# Patient Record
Sex: Male | Born: 2000 | Race: White | Hispanic: No | Marital: Single | Smoking: Never smoker
Health system: Southern US, Community
[De-identification: ages and names within clinical notes are randomized; demographics above are authoritative.]

---
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
## 2019-08-27 ENCOUNTER — Emergency Department (HOSPITAL_COMMUNITY)
Admission: EM | Admit: 2019-08-27 | Discharge: 2019-08-28 | Disposition: A | Discharge status: Home / Self Care | Payer: 59 | Attending: Emergency Medicine | Admitting: Emergency Medicine

## 2019-08-27 ENCOUNTER — Other Ambulatory Visit: Payer: Self-pay

## 2019-08-27 ENCOUNTER — Encounter (HOSPITAL_COMMUNITY): Payer: Self-pay | Admitting: Emergency Medicine

## 2019-08-27 DIAGNOSIS — Z5321 Procedure and treatment not carried out due to patient leaving prior to being seen by health care provider: Secondary | ICD-10-CM | POA: Insufficient documentation

## 2019-08-27 DIAGNOSIS — H5712 Ocular pain, left eye: Secondary | ICD-10-CM | POA: Insufficient documentation

## 2019-08-27 NOTE — ED Triage Notes (Signed)
Pt sent from [facility] to the ED to have a piece of rust removed for his left eye.

## 2020-02-20 IMAGING — CR DG SHOULDER 2+V*L*
3 series · 3 of 3 positions shown · non-contrast
Comparison: None.

CLINICAL DATA: 17-year-old male with chronic left shoulder pain. No
known injury. Initial encounter.

EXAM:
LEFT SHOULDER - 2+ VIEW

[w shoulder ap internal left]
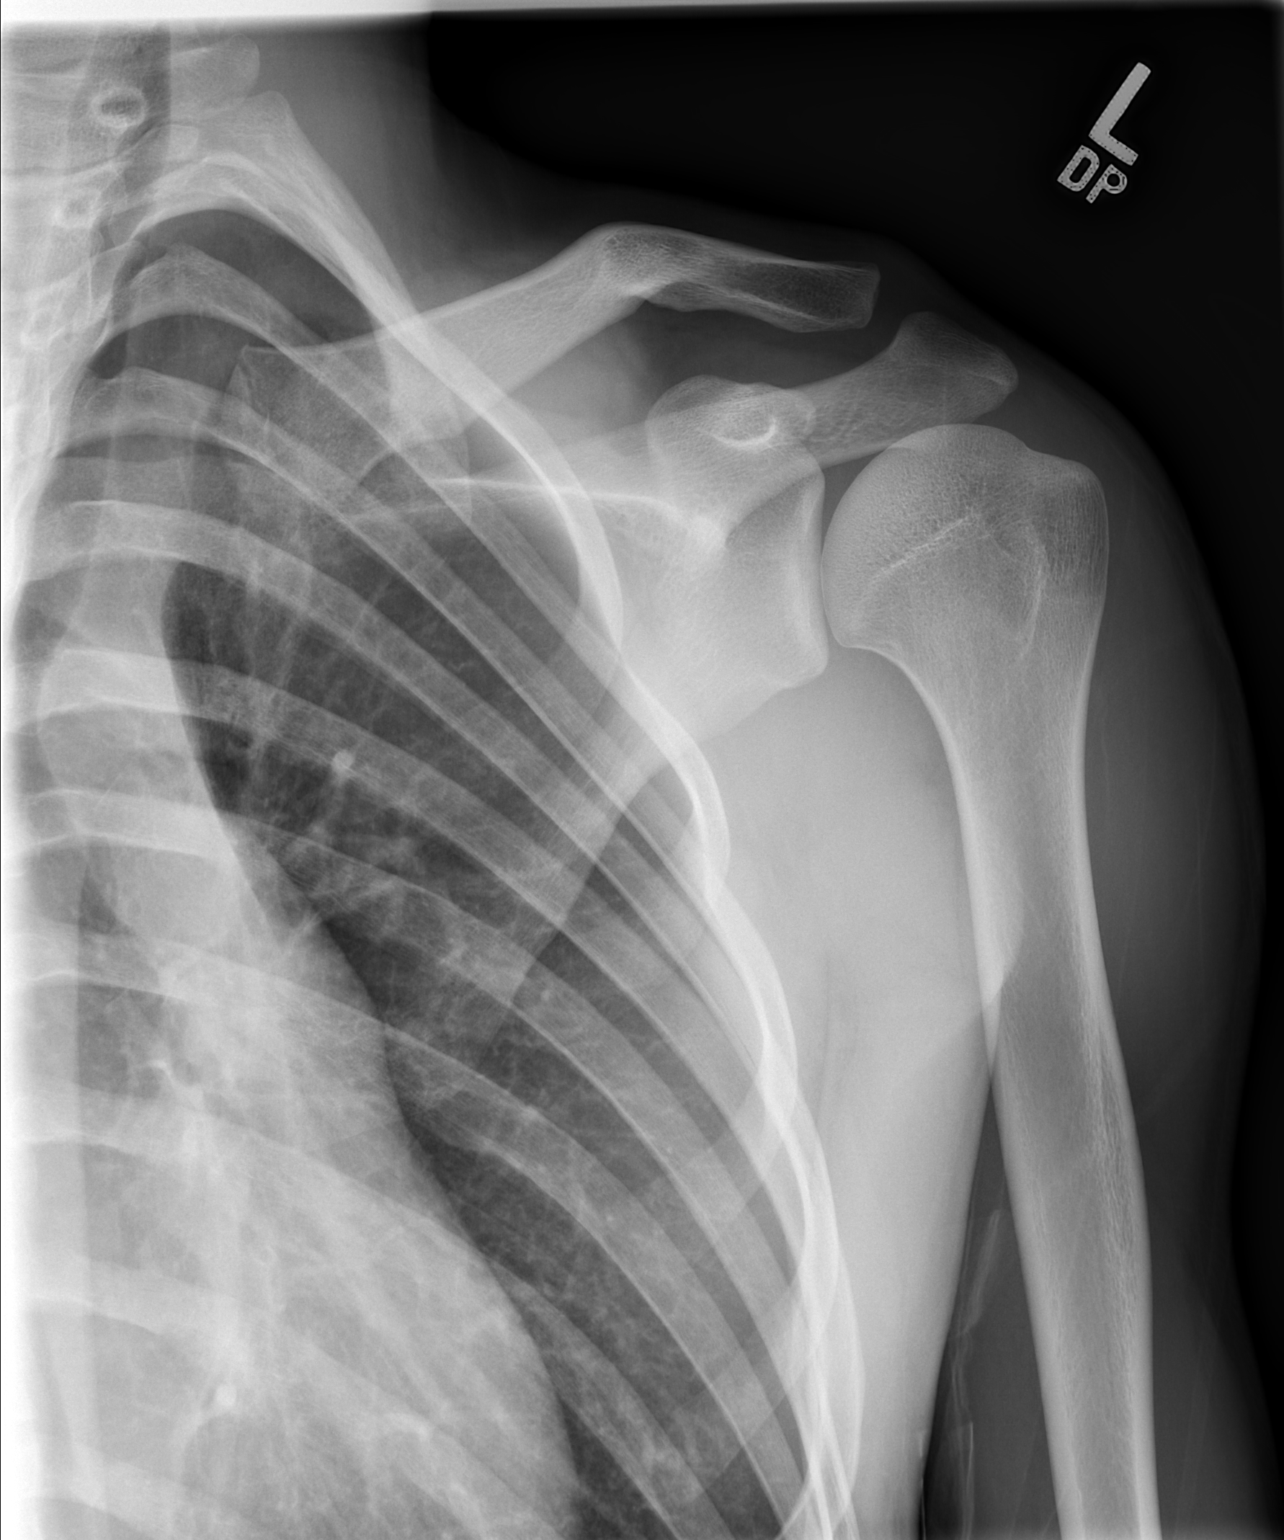

[w shoulder y view left]
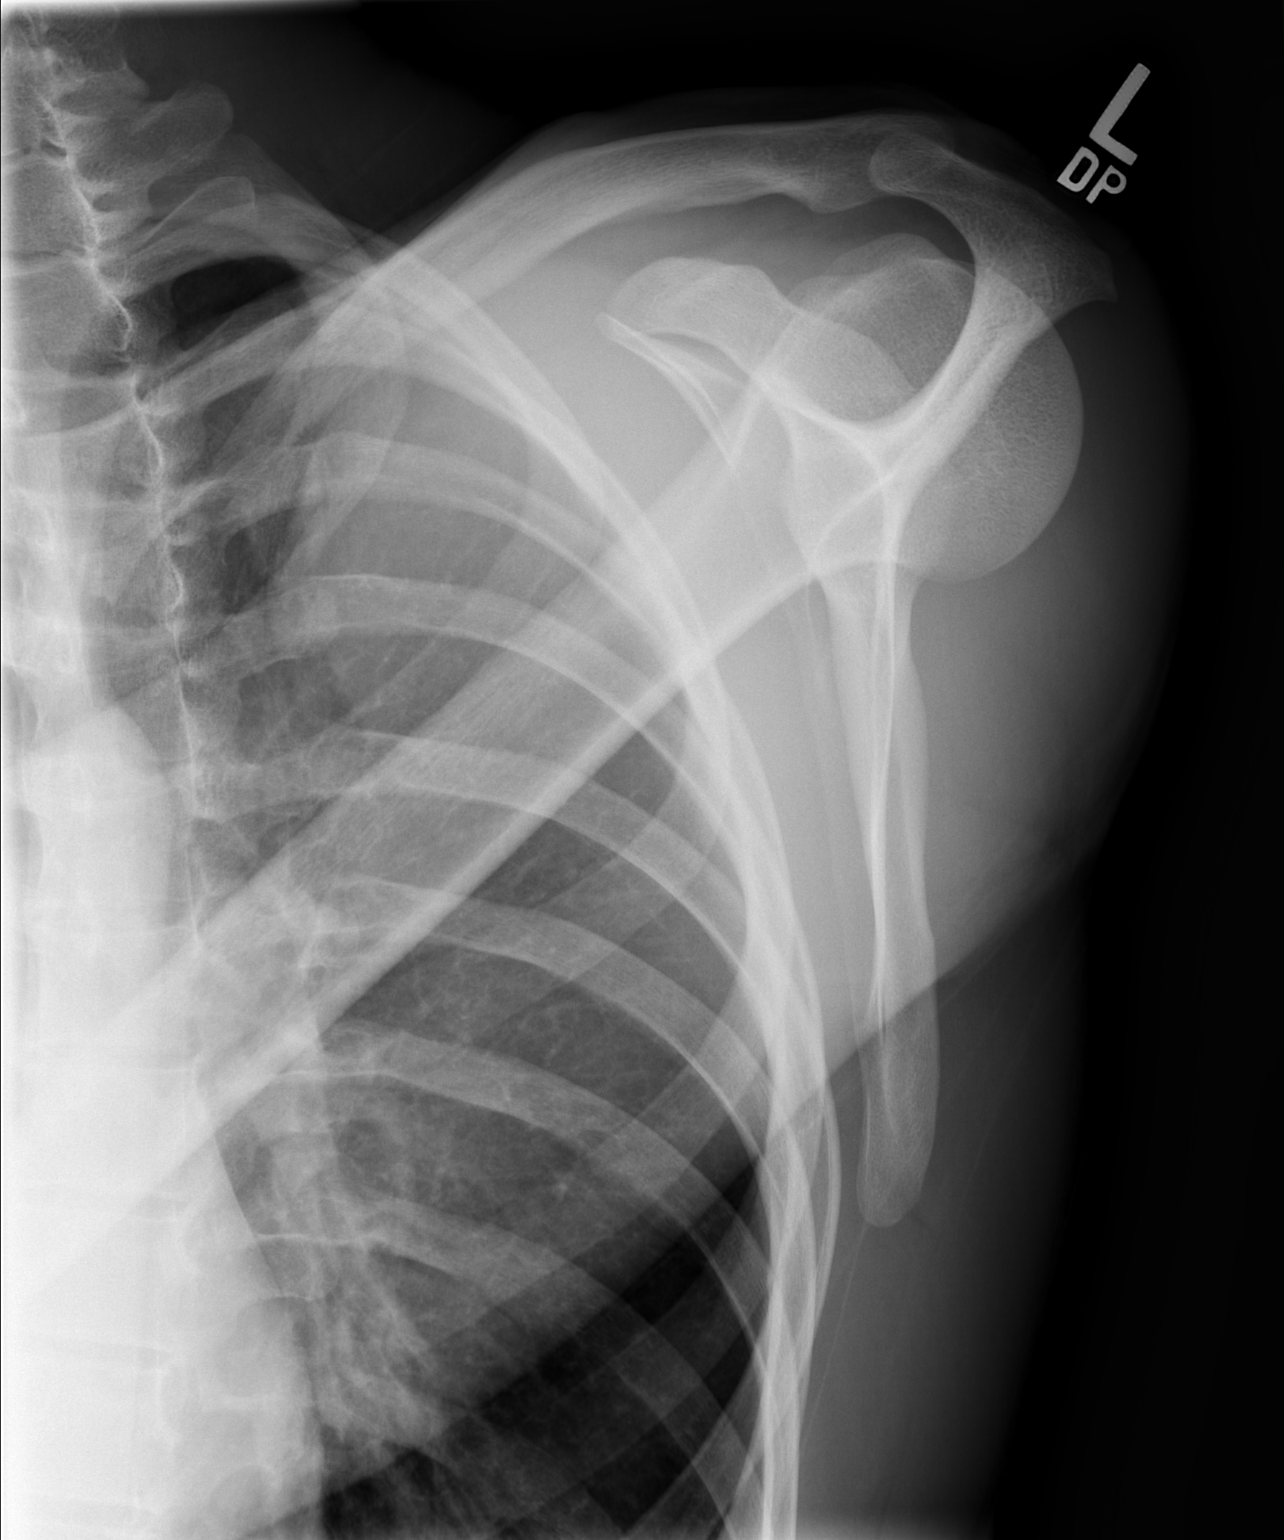

[w shoulder axillary left *]
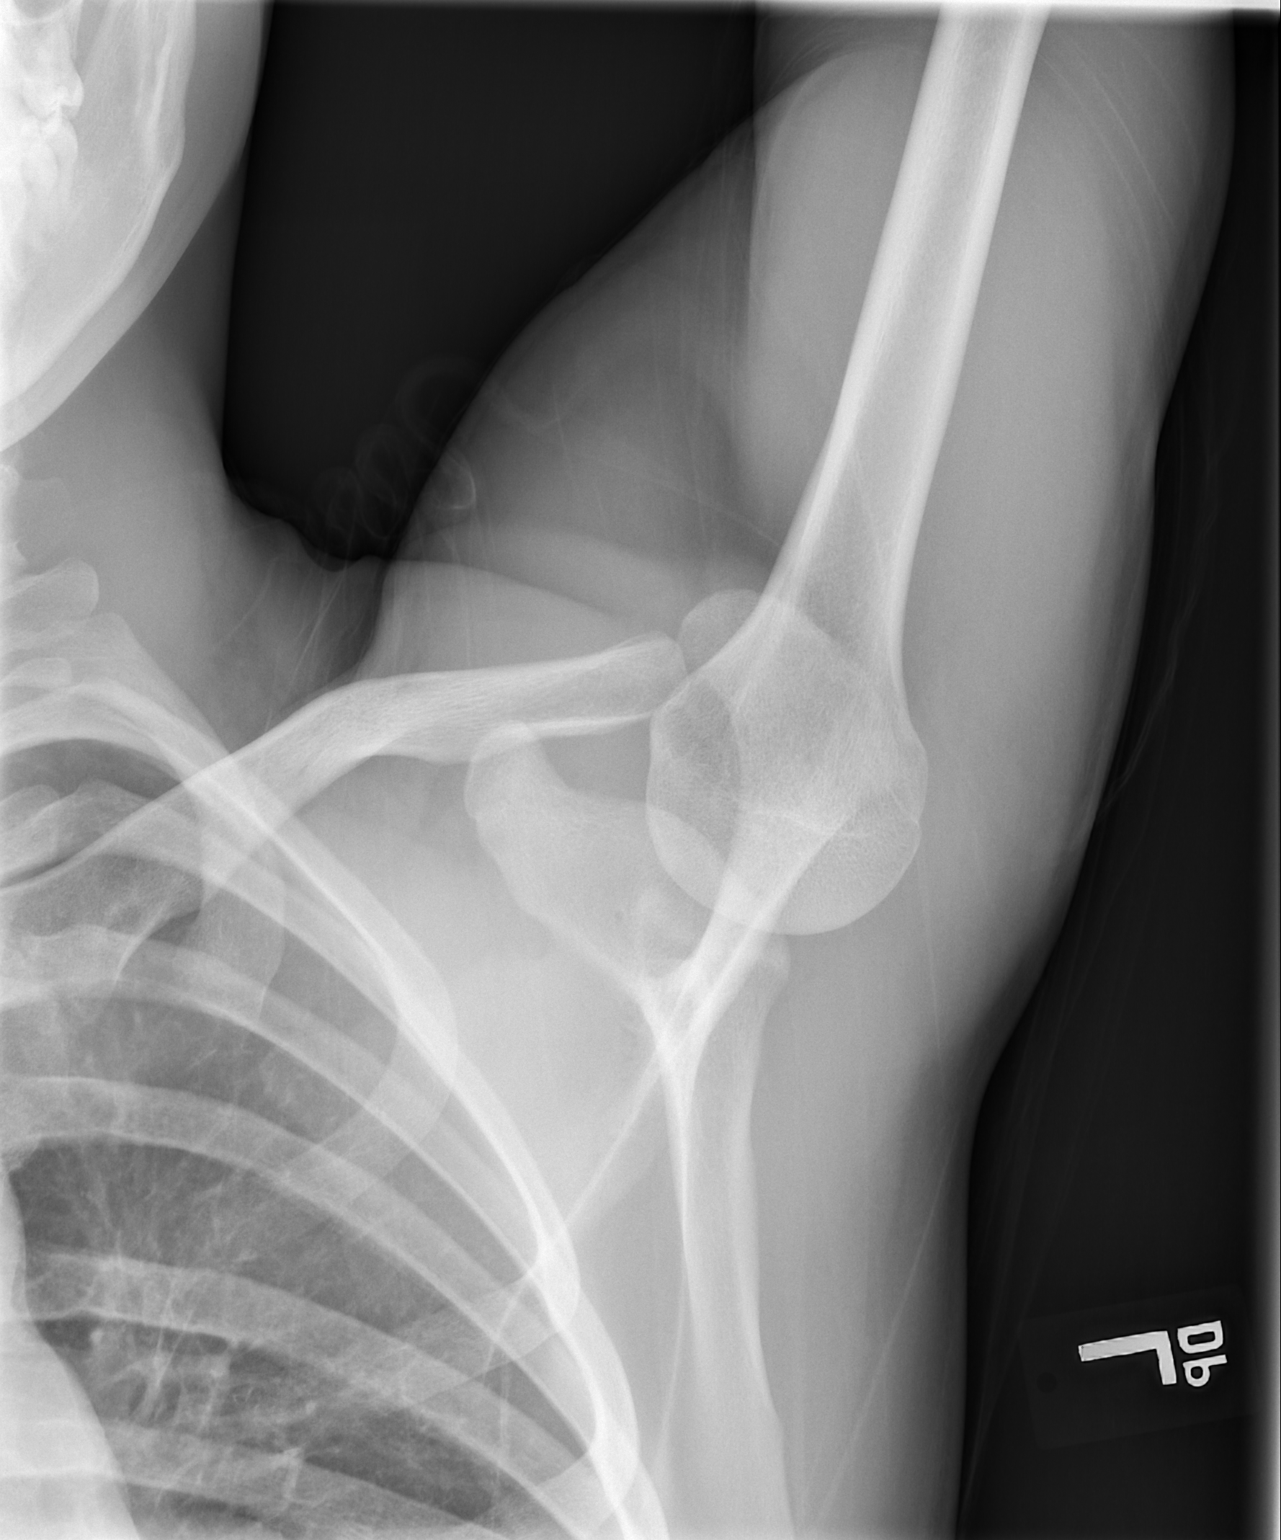

[3 of 3 positions shown; findings below may reference images not displayed]

FINDINGS: There is no evidence of fracture or dislocation. There is no
evidence of arthropathy or other focal bone abnormality. Soft
tissues are unremarkable.
IMPRESSION: Negative.
